# Patient Record
Sex: Female | Born: 1990 | Race: Asian | Hispanic: No | Marital: Married | Smoking: Current every day smoker
Health system: Southern US, Community
[De-identification: ages and names within clinical notes are randomized; demographics above are authoritative.]

---
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
## 2017-03-28 ENCOUNTER — Encounter: Payer: Self-pay | Admitting: Physician Assistant

## 2017-03-28 ENCOUNTER — Other Ambulatory Visit: Payer: Self-pay

## 2017-03-28 ENCOUNTER — Ambulatory Visit (INDEPENDENT_AMBULATORY_CARE_PROVIDER_SITE_OTHER): Payer: Managed Care, Other (non HMO) | Admitting: Physician Assistant

## 2017-03-28 VITALS — BP 106/73 | HR 83 | Resp 16 | Ht 66.0 in | Wt 220.4 lb

## 2017-03-28 DIAGNOSIS — J069 Acute upper respiratory infection, unspecified: Secondary | ICD-10-CM

## 2017-03-28 DIAGNOSIS — B9789 Other viral agents as the cause of diseases classified elsewhere: Secondary | ICD-10-CM

## 2017-03-28 NOTE — Patient Instructions (Addendum)
     IF you received an x-ray today, you will receive an invoice from [facility]. Please contact  Radiology at [phone] with questions or concerns regarding your invoice.   IF you received labwork today, you will receive an invoice from [facility]. Please contact [facility] at [phone] with questions or concerns regarding your invoice.   Our billing staff will not be able to assist you with questions regarding bills from these companies.  You will be contacted with the lab results as soon as they are available. The fastest way to get your results is to activate your My Chart account. Instructions are located on the last page of this paperwork. If you have not heard from us regarding the results in 2 weeks, please contact this office.     

## 2017-03-28 NOTE — Progress Notes (Signed)
    03/28/2017 11:32 AM   DOB: [birth year] / MRN: [id number]  SUBJECTIVE:  Paige Mathis is a 27 y.o. female presenting for cough times 2 weeks.  Cough came with sore throat nasal congestion, both of which have largely resolved.  She tells me her boyfriend has been ill and was seen by his clinician and given antibiotics.  She feels she is getting better cough is worse during the daytime.  She works as a [employer] and has been doing this for about 3 years now.  Denies fever.  She has no allergies on file.   She  has no past medical history on file.    She  reports that  has never smoked. she has never used smokeless tobacco. She reports that she does not drink alcohol or use drugs. She  has no sexual activity history on file. The patient  has no past surgical history on file.  Her family history is not on file.  Review of Systems  Constitutional: Negative for fever.  Gastrointestinal: Negative for nausea.  Genitourinary: Negative for dysuria.  Skin: Negative for rash.  Neurological: Negative for dizziness.    The problem list and medications were reviewed and updated by myself where necessary and exist elsewhere in the encounter.   OBJECTIVE:  BP 106/73 (BP Location: Right Arm, Patient Position: Sitting, Cuff Size: Normal)   Pulse 83   Resp 16   Ht 5\' 6"  (1.676 m)   Wt 220 lb 6.4 oz (100 kg)   SpO2 98%   BMI 35.57 kg/m   Physical Exam  Constitutional: She is active.  Non-toxic appearance.  Cardiovascular: Normal rate, regular rhythm, S1 normal, S2 normal, normal heart sounds and intact distal pulses. Exam reveals no gallop, no friction rub and no decreased pulses.  No murmur heard. Pulmonary/Chest: Effort normal. No stridor. No tachypnea. No respiratory distress. She has no wheezes. She has no rales.  Abdominal: She exhibits no distension.  Musculoskeletal: She exhibits no edema.  Neurological: She is alert.  Skin: Skin is warm and dry. She is not diaphoretic. No pallor.      No results found for this or any previous visit (from the past 72 hour(s)).  No results found.  ASSESSMENT AND PLAN:  Junod was seen today for cough.  Diagnoses and all orders for this visit:  Viral URI with cough Comments: Symptoms resolving.  Advised to stay the course.  Mucinex DM.    The patient is advised to call or return to clinic if she does not see an improvement in symptoms, or to seek the care of the closest emergency department if she worsens with the above plan.   Deliah BostonMichael Danaya Geddis, MHS, PA-C Primary Care at [facility] 03/28/2017 11:32 AM

## 2017-06-28 ENCOUNTER — Ambulatory Visit: Payer: Self-pay | Admitting: Physician Assistant

## 2017-06-28 ENCOUNTER — Ambulatory Visit (INDEPENDENT_AMBULATORY_CARE_PROVIDER_SITE_OTHER): Admitting: Family Medicine

## 2017-06-28 ENCOUNTER — Encounter: Payer: Self-pay | Admitting: Family Medicine

## 2017-06-28 VITALS — BP 110/64 | HR 99 | Temp 98.3°F | Ht 64.0 in | Wt 210.8 lb

## 2017-06-28 DIAGNOSIS — J309 Allergic rhinitis, unspecified: Secondary | ICD-10-CM

## 2017-06-28 DIAGNOSIS — R05 Cough: Secondary | ICD-10-CM

## 2017-06-28 DIAGNOSIS — R059 Cough, unspecified: Secondary | ICD-10-CM

## 2017-06-28 MED ORDER — FLUTICASONE PROPIONATE 50 MCG/ACT NA SUSP: 1.0000 | Freq: Every day | NASAL | 6 refills | Status: DC

## 2017-06-28 NOTE — Progress Notes (Signed)
Subjective:  By signing my name below, I, Essence Howell, attest that this documentation has been prepared under the direction and in the presence of Shade Flood, MD Electronically Signed: Charline Bills, ED Scribe 06/28/2017 at 10:17 AM.   Patient ID: Paige Mathis, female    DOB: 01/05/1991, 27 y.o.   MRN: [id number]  Chief Complaint  Patient presents with  . Dry Cough    since Feb. it may be allergies   HPI Paige Mathis is a 27 y.o. female who presents to Primary Care at [facility] complaining of ongoing dry cough x 3 months. Pt reports associated symptoms of nasal congestion, rhinorrhea, throat clearing, eye crusting upon waking in the morning. She has tried Careers adviser, Xyzal, Zyrtec with temporary relief, Benadryl nightly with some relief. She has noticed some drowsiness with Zyrtec. Denies fever, hemoptysis, night sweats, unexplained weight loss, heart burn, cp, sob.  There are no active problems to display for this patient.  No past medical history on file.  Allergies no known allergies Prior to Admission medications   Not on File   Social History   Socioeconomic History  . Marital status: Single    Spouse name: Not on file  . Number of children: Not on file  . Years of education: Not on file  . Highest education level: Not on file  Occupational History  . Not on file  Social Needs  . Financial resource strain: Not on file  . Food insecurity:    Worry: Not on file    Inability: Not on file  . Transportation needs:    Medical: Not on file    Non-medical: Not on file  Tobacco Use  . Smoking status: Current Every Day Smoker    Types: Cigarettes  . Smokeless tobacco: Never Used  Substance and Sexual Activity  . Alcohol use: No    Frequency: Never  . Drug use: No  . Sexual activity: Yes  Lifestyle  . Physical activity:    Days per week: Not on file    Minutes per session: Not on file  . Stress: Not on file  Relationships  . Social  connections:    Talks on phone: Not on file    Gets together: Not on file    Attends religious service: Not on file    Active member of club or organization: Not on file    Attends meetings of clubs or organizations: Not on file    Relationship status: Not on file  . Intimate partner violence:    Fear of current or ex partner: Not on file    Emotionally abused: Not on file    Physically abused: Not on file    Forced sexual activity: Not on file  Other Topics Concern  . Not on file  Social History Narrative  . Not on file   Review of Systems  Constitutional: Negative for diaphoresis, fever and unexpected weight change.  HENT: Positive for congestion and rhinorrhea.   Eyes: Positive for discharge (crusting).  Respiratory: Positive for cough. Negative for shortness of breath.   Cardiovascular: Negative for chest pain.  Allergic/Immunologic: Positive for environmental allergies.      Objective:   Physical Exam  Constitutional: She is oriented to person, place, and time. She appears well-developed and well-nourished. No distress.  HENT:  Head: Normocephalic and atraumatic.  Right Ear: Hearing, tympanic membrane, external ear and ear canal normal.  Left Ear: Hearing, tympanic membrane, external ear and ear canal normal.  Mouth/Throat: Oropharynx is clear and moist. No oropharyngeal exudate.  Slightly boggy turbinates bilaterally. Septum intact.  Eyes: Pupils are equal, round, and reactive to light. Conjunctivae and EOM are normal.  Cardiovascular: Normal rate, regular rhythm, normal heart sounds and intact distal pulses.  No murmur heard. Pulmonary/Chest: Effort normal and breath sounds normal. No stridor. No respiratory distress. She has no wheezes. She has no rhonchi.  Neurological: She is alert and oriented to person, place, and time.  Skin: Skin is warm and dry. No rash noted.  Psychiatric: She has a normal mood and affect. Her behavior is normal.  Vitals reviewed.  Vitals:     06/28/17 0947  BP: 110/64  Pulse: 99  Temp: 98.3 F (36.8 C)  TempSrc: Oral  SpO2: 98%  Weight: 210 lb 12.8 oz (95.6 kg)  Height:  (1.626 m)      Assessment & Plan:    Paige Mathis is a 27 y.o. female Allergic rhinitis, unspecified seasonality, unspecified trigger - Plan: fluticasone (FLONASE) 50 MCG/ACT nasal spray  Cough - Plan: fluticasone (FLONASE) 50 MCG/ACT nasal spray  Suspected allergies with postnasal drip.  Reassuring exam.  Continue less sedating antihistamine, with Benadryl at night only if needed.  Start Flonase nasal spray and correct technique discussed, handout given on allergies and cough.  RTC precautions if not improving in the next few weeks.  Meds ordered this encounter  Medications  . fluticasone (FLONASE) 50 MCG/ACT nasal spray    Sig: Place 1-2 sprays into both nostrils daily.    Dispense:  16 g    Refill:  6   Patient Instructions    Your cough appears to be due to allergies and postnasal drip.  See information below on cough as well as allergies.  Okay to continue Allegra once per day, Benadryl only if needed at night but start Flonase nasal spray 1 to 2 sprays in each nostril using the technique we discussed.  If that cough is not improving in the next few weeks, return to discuss other causes.  Return to the clinic or go to the nearest emergency room if any of your symptoms worsen or new symptoms occur.  Thank you for coming in today.  Allergic Rhinitis, Adult Allergic rhinitis is an allergic reaction that affects the mucous membrane inside the nose. It causes sneezing, a runny or stuffy nose, and the feeling of mucus going down the back of the throat (postnasal drip). Allergic rhinitis can be mild to severe. There are two types of allergic rhinitis:  Seasonal. This type is also called hay fever. It happens only during certain seasons.  Perennial. This type can happen at any time of the year.  What are the causes? This  condition happens when the body's defense system (immune system) responds to certain harmless substances called allergens as though they were germs.  Seasonal allergic rhinitis is triggered by pollen, which can come from grasses, trees, and weeds. Perennial allergic rhinitis may be caused by:  House dust mites.  Pet dander.  Mold spores.  What are the signs or symptoms? Symptoms of this condition include:  Sneezing.  Runny or stuffy nose (nasal congestion).  Postnasal drip.  Itchy nose.  Tearing of the eyes.  Trouble sleeping.  Daytime sleepiness.  How is this diagnosed? This condition may be diagnosed based on:  Your medical history.  A physical exam.  Tests to check for related conditions, such as: ? Asthma. ? Pink eye. ? Ear infection. ? Upper respiratory  infection.  Tests to find out which allergens trigger your symptoms. These may include skin or blood tests.  How is this treated? There is no cure for this condition, but treatment can help control symptoms. Treatment may include:  Taking medicines that block allergy symptoms, such as antihistamines. Medicine may be given as a shot, nasal spray, or pill.  Avoiding the allergen.  Desensitization. This treatment involves getting ongoing shots until your body becomes less sensitive to the allergen. This treatment may be done if other treatments do not help.  If taking medicine and avoiding the allergen does not work, new, stronger medicines may be prescribed.  Follow these instructions at home:  Find out what you are allergic to. Common allergens include smoke, dust, and pollen.  Avoid the things you are allergic to. These are some things you can do to help avoid allergens: ? Replace carpet with wood, tile, or vinyl flooring. Carpet can trap dander and dust. ? Do not smoke. Do not allow smoking in your home. ? Change your heating and air conditioning filter at least once a month. ? During allergy  season:  Keep windows closed as much as possible.  Plan outdoor activities when pollen counts are lowest. This is usually during the evening hours.  When coming indoors, change clothing and shower before sitting on furniture or bedding.  Take over-the-counter and prescription medicines only as told by your health care provider.  Keep all follow-up visits as told by your health care provider. This is important. Contact a health care provider if:  You have a fever.  You develop a persistent cough.  You make whistling sounds when you breathe (you wheeze).  Your symptoms interfere with your normal daily activities. Get help right away if:  You have shortness of breath. Summary  This condition can be managed by taking medicines as directed and avoiding allergens.  Contact your health care provider if you develop a persistent cough or fever.  During allergy season, keep windows closed as much as possible. This information is not intended to replace advice given to you by your health care provider. Make sure you discuss any questions you have with your health care provider. Document Released: 10/18/2000 Document Revised: 03/02/2016 Document Reviewed: 03/02/2016 Elsevier Interactive Patient Education  2018 [employer].  Cough, Adult Coughing is a reflex that clears your throat and your airways. Coughing helps to heal and protect your lungs. It is normal to cough occasionally, but a cough that happens with other symptoms or lasts a long time may be a sign of a condition that needs treatment. A cough may last only 2-3 weeks (acute), or it may last longer than 8 weeks (chronic). What are the causes? Coughing is commonly caused by:  Breathing in substances that irritate your lungs.  A viral or bacterial respiratory infection.  Allergies.  Asthma.  Postnasal drip.  Smoking.  Acid backing up from the stomach into the esophagus (gastroesophageal reflux).  Certain  medicines.  Chronic lung problems, including COPD (or rarely, lung cancer).  Other medical conditions such as heart failure.  Follow these instructions at home: Pay attention to any changes in your symptoms. Take these actions to help with your discomfort:  Take medicines only as told by your health care provider. ? If you were prescribed an antibiotic medicine, take it as told by your health care provider. Do not stop taking the antibiotic even if you start to feel better. ? Talk with your health care provider before you  take a cough suppressant medicine.  Drink enough fluid to keep your urine clear or pale yellow.  If the air is dry, use a cold steam vaporizer or humidifier in your bedroom or your home to help loosen secretions.  Avoid anything that causes you to cough at work or at home.  If your cough is worse at night, try sleeping in a semi-upright position.  Avoid cigarette smoke. If you smoke, quit smoking. If you need help quitting, ask your health care provider.  Avoid caffeine.  Avoid alcohol.  Rest as needed.  Contact a health care provider if:  You have new symptoms.  You cough up pus.  Your cough does not get better after 2-3 weeks, or your cough gets worse.  You cannot control your cough with suppressant medicines and you are losing sleep.  You develop pain that is getting worse or pain that is not controlled with pain medicines.  You have a fever.  You have unexplained weight loss.  You have night sweats. Get help right away if:  You cough up blood.  You have difficulty breathing.  Your heartbeat is very fast. This information is not intended to replace advice given to you by your health care provider. Make sure you discuss any questions you have with your health care provider. Document Released: 07/22/2010 Document Revised: 07/01/2015 Document Reviewed: 04/01/2014 Elsevier Interactive Patient Education  2018 [employer].    IF you received  an x-ray today, you will receive an invoice from [facility]. Please contact [facility] at [phone] with questions or concerns regarding your invoice.   IF you received labwork today, you will receive an invoice from [facility]. Please contact [facility] at [phone] with questions or concerns regarding your invoice.   Our billing staff will not be able to assist you with questions regarding bills from these companies.  You will be contacted with the lab results as soon as they are available. The fastest way to get your results is to activate your My Chart account. Instructions are located on the last page of this paperwork. If you have not heard from [place] regarding the results in 2 weeks, please contact this office.       I personally performed the services described in this documentation, which was scribed in my presence. The recorded information has been reviewed and considered for accuracy and completeness, addended by me as needed, and agree with information above.  Signed,   Meredith Staggers, MD Primary Care at [facility].  06/28/17 10:31 AM

## 2017-06-28 NOTE — Patient Instructions (Addendum)
Your cough appears to be due to allergies and postnasal drip.  See information below on cough as well as allergies.  Okay to continue Allegra once per day, Benadryl only if needed at night but start Flonase nasal spray 1 to 2 sprays in each nostril using the technique we discussed.  If that cough is not improving in the next few weeks, return to discuss other causes.  Return to the clinic or go to the nearest emergency room if any of your symptoms worsen or new symptoms occur.  Thank you for coming in today.  Allergic Rhinitis, Adult Allergic rhinitis is an allergic reaction that affects the mucous membrane inside the nose. It causes sneezing, a runny or stuffy nose, and the feeling of mucus going down the back of the throat (postnasal drip). Allergic rhinitis can be mild to severe. There are two types of allergic rhinitis:  Seasonal. This type is also called hay fever. It happens only during certain seasons.  Perennial. This type can happen at any time of the year.  What are the causes? This condition happens when the body's defense system (immune system) responds to certain harmless substances called allergens as though they were germs.  Seasonal allergic rhinitis is triggered by pollen, which can come from grasses, trees, and weeds. Perennial allergic rhinitis may be caused by:  House dust mites.  Pet dander.  Mold spores.  What are the signs or symptoms? Symptoms of this condition include:  Sneezing.  Runny or stuffy nose (nasal congestion).  Postnasal drip.  Itchy nose.  Tearing of the eyes.  Trouble sleeping.  Daytime sleepiness.  How is this diagnosed? This condition may be diagnosed based on:  Your medical history.  A physical exam.  Tests to check for related conditions, such as: ? Asthma. ? Pink eye. ? Ear infection. ? Upper respiratory infection.  Tests to find out which allergens trigger your symptoms. These may include skin or blood tests.  How is  this treated? There is no cure for this condition, but treatment can help control symptoms. Treatment may include:  Taking medicines that block allergy symptoms, such as antihistamines. Medicine may be given as a shot, nasal spray, or pill.  Avoiding the allergen.  Desensitization. This treatment involves getting ongoing shots until your body becomes less sensitive to the allergen. This treatment may be done if other treatments do not help.  If taking medicine and avoiding the allergen does not work, new, stronger medicines may be prescribed.  Follow these instructions at home:  Find out what you are allergic to. Common allergens include smoke, dust, and pollen.  Avoid the things you are allergic to. These are some things you can do to help avoid allergens: ? Replace carpet with wood, tile, or vinyl flooring. Carpet can trap dander and dust. ? Do not smoke. Do not allow smoking in your home. ? Change your heating and air conditioning filter at least once a month. ? During allergy season:  Keep windows closed as much as possible.  Plan outdoor activities when pollen counts are lowest. This is usually during the evening hours.  When coming indoors, change clothing and shower before sitting on furniture or bedding.  Take over-the-counter and prescription medicines only as told by your health care provider.  Keep all follow-up visits as told by your health care provider. This is important. Contact a health care provider if:  You have a fever.  You develop a persistent cough.  You make whistling sounds when  you breathe (you wheeze).  Your symptoms interfere with your normal daily activities. Get help right away if:  You have shortness of breath. Summary  This condition can be managed by taking medicines as directed and avoiding allergens.  Contact your health care provider if you develop a persistent cough or fever.  During allergy season, keep windows closed as much as  possible. This information is not intended to replace advice given to you by your health care provider. Make sure you discuss any questions you have with your health care provider. Document Released: 10/18/2000 Document Revised: 03/02/2016 Document Reviewed: 03/02/2016 Elsevier Interactive Patient Education  2018 [employer].  Cough, Adult Coughing is a reflex that clears your throat and your airways. Coughing helps to heal and protect your lungs. It is normal to cough occasionally, but a cough that happens with other symptoms or lasts a long time may be a sign of a condition that needs treatment. A cough may last only 2-3 weeks (acute), or it may last longer than 8 weeks (chronic). What are the causes? Coughing is commonly caused by:  Breathing in substances that irritate your lungs.  A viral or bacterial respiratory infection.  Allergies.  Asthma.  Postnasal drip.  Smoking.  Acid backing up from the stomach into the esophagus (gastroesophageal reflux).  Certain medicines.  Chronic lung problems, including COPD (or rarely, lung cancer).  Other medical conditions such as heart failure.  Follow these instructions at home: Pay attention to any changes in your symptoms. Take these actions to help with your discomfort:  Take medicines only as told by your health care provider. ? If you were prescribed an antibiotic medicine, take it as told by your health care provider. Do not stop taking the antibiotic even if you start to feel better. ? Talk with your health care provider before you take a cough suppressant medicine.  Drink enough fluid to keep your urine clear or pale yellow.  If the air is dry, use a cold steam vaporizer or humidifier in your bedroom or your home to help loosen secretions.  Avoid anything that causes you to cough at work or at home.  If your cough is worse at night, try sleeping in a semi-upright position.  Avoid cigarette smoke. If you smoke, quit  smoking. If you need help quitting, ask your health care provider.  Avoid caffeine.  Avoid alcohol.  Rest as needed.  Contact a health care provider if:  You have new symptoms.  You cough up pus.  Your cough does not get better after 2-3 weeks, or your cough gets worse.  You cannot control your cough with suppressant medicines and you are losing sleep.  You develop pain that is getting worse or pain that is not controlled with pain medicines.  You have a fever.  You have unexplained weight loss.  You have night sweats. Get help right away if:  You cough up blood.  You have difficulty breathing.  Your heartbeat is very fast. This information is not intended to replace advice given to you by your health care provider. Make sure you discuss any questions you have with your health care provider. Document Released: 07/22/2010 Document Revised: 07/01/2015 Document Reviewed: 04/01/2014 Elsevier Interactive Patient Education  2018 [employer].    IF you received an x-ray today, you will receive an invoice from [facility]. Please contact [facility] at [phone] with questions or concerns regarding your invoice.   IF you received labwork today, you will receive  an Pharmacologist from [facility]. Please contact [place] at [phone] with questions or concerns regarding your invoice.   Our billing staff will not be able to assist you with questions regarding bills from these companies.  You will be contacted with the lab results as soon as they are available. The fastest way to get your results is to activate your My Chart account. Instructions are located on the last page of this paperwork. If you have not heard from [place] regarding the results in 2 weeks, please contact this office.

## 2019-08-07 ENCOUNTER — Ambulatory Visit (INDEPENDENT_AMBULATORY_CARE_PROVIDER_SITE_OTHER)
Admission: EM | Admit: 2019-08-07 | Discharge: 2019-08-07 | Disposition: A | Discharge status: Home / Self Care | Source: Home / Self Care

## 2019-08-07 ENCOUNTER — Encounter (HOSPITAL_COMMUNITY): Payer: Self-pay

## 2019-08-07 ENCOUNTER — Encounter (HOSPITAL_COMMUNITY): Payer: Self-pay | Admitting: Emergency Medicine

## 2019-08-07 ENCOUNTER — Ambulatory Visit (INDEPENDENT_AMBULATORY_CARE_PROVIDER_SITE_OTHER)

## 2019-08-07 ENCOUNTER — Emergency Department (HOSPITAL_COMMUNITY)
Admission: EM | Admit: 2019-08-07 | Discharge: 2019-08-07 | Disposition: A | Discharge status: Home / Self Care | Attending: Emergency Medicine | Admitting: Emergency Medicine

## 2019-08-07 ENCOUNTER — Other Ambulatory Visit: Payer: Self-pay

## 2019-08-07 ENCOUNTER — Emergency Department (HOSPITAL_COMMUNITY)

## 2019-08-07 DIAGNOSIS — S97111A Crushing injury of right great toe, initial encounter: Secondary | ICD-10-CM

## 2019-08-07 DIAGNOSIS — S92424B Nondisplaced fracture of distal phalanx of right great toe, initial encounter for open fracture: Secondary | ICD-10-CM | POA: Diagnosis not present

## 2019-08-07 DIAGNOSIS — S91211A Laceration without foreign body of right great toe with damage to nail, initial encounter: Secondary | ICD-10-CM | POA: Diagnosis not present

## 2019-08-07 DIAGNOSIS — Y929 Unspecified place or not applicable: Secondary | ICD-10-CM | POA: Insufficient documentation

## 2019-08-07 DIAGNOSIS — F17210 Nicotine dependence, cigarettes, uncomplicated: Secondary | ICD-10-CM | POA: Diagnosis not present

## 2019-08-07 DIAGNOSIS — S6991XA Unspecified injury of right wrist, hand and finger(s), initial encounter: Secondary | ICD-10-CM

## 2019-08-07 DIAGNOSIS — S90111A Contusion of right great toe without damage to nail, initial encounter: Secondary | ICD-10-CM

## 2019-08-07 DIAGNOSIS — Z23 Encounter for immunization: Secondary | ICD-10-CM | POA: Diagnosis not present

## 2019-08-07 DIAGNOSIS — S92421A Displaced fracture of distal phalanx of right great toe, initial encounter for closed fracture: Secondary | ICD-10-CM

## 2019-08-07 DIAGNOSIS — Y939 Activity, unspecified: Secondary | ICD-10-CM | POA: Insufficient documentation

## 2019-08-07 DIAGNOSIS — S91111A Laceration without foreign body of right great toe without damage to nail, initial encounter: Secondary | ICD-10-CM | POA: Diagnosis present

## 2019-08-07 DIAGNOSIS — M7989 Other specified soft tissue disorders: Secondary | ICD-10-CM

## 2019-08-07 DIAGNOSIS — S99921A Unspecified injury of right foot, initial encounter: Secondary | ICD-10-CM

## 2019-08-07 DIAGNOSIS — W230XXA Caught, crushed, jammed, or pinched between moving objects, initial encounter: Secondary | ICD-10-CM | POA: Diagnosis not present

## 2019-08-07 DIAGNOSIS — Y999 Unspecified external cause status: Secondary | ICD-10-CM | POA: Insufficient documentation

## 2019-08-07 MED ORDER — LIDOCAINE HCL (PF) 1 % IJ SOLN
10.0000 mL | Freq: Once | INTRAMUSCULAR | Status: AC
  Administered 2019-08-07: 10 mL
  Filled 2019-08-07: qty 30

## 2019-08-07 MED ORDER — CEPHALEXIN 500 MG PO CAPS
500.0000 mg | ORAL_CAPSULE | Freq: Four times a day (QID) | ORAL | 0 refills | Status: DC
Start: 2019-08-07 — End: 2019-08-08

## 2019-08-07 MED ORDER — TETANUS-DIPHTH-ACELL PERTUSSIS 5-2.5-18.5 LF-MCG/0.5 IM SUSP
0.5000 mL | Freq: Once | INTRAMUSCULAR | Status: AC
  Administered 2019-08-07: 0.5 mL via INTRAMUSCULAR
  Filled 2019-08-07: qty 0.5

## 2019-08-07 MED ORDER — NAPROXEN 500 MG PO TABS
500.0000 mg | ORAL_TABLET | Freq: Two times a day (BID) | ORAL | 0 refills | Status: DC
Start: 2019-08-07 — End: 2019-08-08
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
## 2019-08-07 NOTE — ED Notes (Signed)
Pt transported to xray 

## 2019-08-07 NOTE — Discharge Instructions (Signed)
Unfortunately, your toe is fractured underneath the laceration.  You will need to go to [facility] or [facility] ER from here to have your toe repaired

## 2019-08-07 NOTE — ED Notes (Signed)
Patient is being discharged from the Urgent Care and sent to the Emergency Department via private vehicle . Per Blondell Reveal, NP, patient is in need of higher level of care due to toe fracture. Patient is aware and verbalizes understanding of plan of care.  Vitals:   08/07/19 1625  BP: 129/78  Pulse: 86  Resp: 18  Temp: 98.6 F (37 C)  SpO2: 100%
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
## 2019-08-07 NOTE — Discharge Instructions (Addendum)
As discussed, your x-ray showed an underlying broken bone. I have placed you in a shoe with crutches.  I have included the number of the orthopedic surgeon.  Please call tomorrow to schedule an appointment for further evaluation.  Please have a wound recheck on Friday by your PCP.  I am sending you home with an antibiotic and pain medication.  Take as prescribed.  Finish all antibiotics.  You will need your sutures removed in 10 to 14 days.  Return to the ER for new or worsening symptoms.

## 2019-08-08 ENCOUNTER — Ambulatory Visit (INDEPENDENT_AMBULATORY_CARE_PROVIDER_SITE_OTHER): Admitting: Family Medicine

## 2019-08-08 VITALS — BP 107/73 | HR 93 | Temp 98.0°F | Ht 64.0 in | Wt 219.0 lb

## 2019-08-08 DIAGNOSIS — S92401D Displaced unspecified fracture of right great toe, subsequent encounter for fracture with routine healing: Secondary | ICD-10-CM | POA: Diagnosis not present

## 2019-08-08 DIAGNOSIS — S91209D Unspecified open wound of unspecified toe(s) with damage to nail, subsequent encounter: Secondary | ICD-10-CM

## 2019-08-08 NOTE — Patient Instructions (Addendum)
Referral is being made to orthopedics  Take the naproxen 500 mg 1 twice daily for pain (you can substitute Aleve 220 mg 2 pills twice daily if needed).  Do not take ibuprofen while taking Aleve/naproxen.  In addition to this you can take Tylenol (acetaminophen) 500 mg 2 pills 3 times daily.  Do not exceed 3000 mg in 24 hours.  Minimize weightbearing.  Use your crutches as needed and wear the hard soled shoe.  Try and taper a plastic bag around your foot when you shower to keep it dry.  In about 3 or 4 days go ahead and change the dressing as we discussed.  You can work as long as you are not weightbearing if you can tolerate the discomfort.  I anticipate that the orthopedic doctor will follow you and remove the sutures, but if you need to return here in 2 weeks to get the sutures removed you can do so.  Call for appointment.  Return sooner if problems.  If you have lab work done today you will be contacted with your lab results within the next 2 weeks.  If you have not heard from [place] then please contact us. The fastest way to get your results is to register for My Chart.   IF you received an x-ray today, you will receive an invoice from [facility]. Please contact [facility] at [phone] with questions or concerns regarding your invoice.   IF you received labwork today, you will receive an invoice from [place]. Please contact [facility] at [phone] with questions or concerns regarding your invoice.   Our billing staff will not be able to assist you with questions regarding bills from these companies.  You will be contacted with the lab results as soon as they are available. The fastest way to get your results is to activate your My Chart account. Instructions are located on the last page of this paperwork. If you have not heard from [place] regarding the results in 2 weeks, please contact this office.

## 2019-08-13 ENCOUNTER — Ambulatory Visit (INDEPENDENT_AMBULATORY_CARE_PROVIDER_SITE_OTHER): Admitting: Orthopaedic Surgery

## 2019-08-13 ENCOUNTER — Encounter: Payer: Self-pay | Admitting: Orthopaedic Surgery

## 2019-08-13 ENCOUNTER — Other Ambulatory Visit: Payer: Self-pay

## 2019-08-13 DIAGNOSIS — S92421B Displaced fracture of distal phalanx of right great toe, initial encounter for open fracture: Secondary | ICD-10-CM | POA: Diagnosis not present

## 2019-08-13 DIAGNOSIS — S92919A Unspecified fracture of unspecified toe(s), initial encounter for closed fracture: Secondary | ICD-10-CM | POA: Insufficient documentation
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
## 2019-08-14 NOTE — Progress Notes (Signed)
Patient ID: Paige Mathis, female    DOB: 09/19/1990  Age: 29 y.o. MRN: [id number]  Chief Complaint  Patient presents with  . Foot Pain    Pt stated that she slammed her rt foot in the door and the toe nail fell off. She comes in today on crutches    Subjective:   Patient slammed the toe of her right foot, large toe, under a door when it hung up on a little ledge.  She had a lot of pain.  She went to the urgent care and on over to the emergency room where x-ray confirmed a fracture of the tip of the tuft of the toe.  It was open and comminuted and mildly displaced.  She came in today again because she is having a lot of pain and wanted to discuss it.  It is been carefully dressed and bound last night.  Current allergies, medications, problem list, past/family and social histories reviewed.  Objective:  BP 107/73   Pulse 93   Temp 98 F (36.7 C) (Temporal)   Ht 5\' 4"  (1.626 m)   Wt 219 lb (99.3 kg)   LMP 08/03/2019   SpO2 97%   BMI 37.59 kg/m   I did not undress the toe.  Discussed treatment with her.  Assessment & Plan:   Assessment: 1. Open displaced fracture of phalanx of right great toe with routine healing, unspecified phalanx, subsequent encounter   2. Open wound of toe with avulsion of toenail, subsequent encounter       Plan: Adjusted her crutches and made referral to orthopedics and instructed her on care and management.  See discharge instructions.  Orders Placed This Encounter  Procedures  . Ambulatory referral to Orthopedic Surgery    Referral Priority:   Routine    Referral Type:   Surgical    Referral Reason:   Specialty Services Required    Requested Specialty:   Orthopedic Surgery    Number of Visits Requested:   1    No orders of the defined types were placed in this encounter.        Patient Instructions   Referral is being made to orthopedics  Take the naproxen 500 mg 1 twice daily for pain (you can substitute Aleve 220 mg 2  pills twice daily if needed).  Do not take ibuprofen while taking Aleve/naproxen.  In addition to this you can take Tylenol (acetaminophen) 500 mg 2 pills 3 times daily.  Do not exceed 3000 mg in 24 hours.  Minimize weightbearing.  Use your crutches as needed and wear the hard soled shoe.  Try and taper a plastic bag around your foot when you shower to keep it dry.  In about 3 or 4 days go ahead and change the dressing as we discussed.  You can work as long as you are not weightbearing if you can tolerate the discomfort.  I anticipate that the orthopedic doctor will follow you and remove the sutures, but if you need to return here in 2 weeks to get the sutures removed you can do so.  Call for appointment.  Return sooner if problems.  If you have lab work done today you will be contacted with your lab results within the next 2 weeks.  If you have not heard from 08/05/2019 then please contact us. The fastest way to get your results is to register for My Chart.   IF you received an x-ray today, you will receive an invoice from  [facility]. Please contact [facility] at [phone] with questions or concerns regarding your invoice.   IF you received labwork today, you will receive an invoice from [place]. Please contact [facility] at [phone] with questions or concerns regarding your invoice.   Our billing staff will not be able to assist you with questions regarding bills from these companies.  You will be contacted with the lab results as soon as they are available. The fastest way to get your results is to activate your My Chart account. Instructions are located on the last page of this paperwork. If you have not heard from [place] regarding the results in 2 weeks, please contact this office.        Return in about 2 weeks (around 08/22/2019), or Return if not able to follow with orthopedist..   Janace Hoard, MD 08/14/2019

## 2019-08-22 ENCOUNTER — Ambulatory Visit (INDEPENDENT_AMBULATORY_CARE_PROVIDER_SITE_OTHER): Admitting: Orthopaedic Surgery

## 2019-08-22 ENCOUNTER — Other Ambulatory Visit: Payer: Self-pay

## 2019-08-22 ENCOUNTER — Ambulatory Visit: Admitting: Family Medicine

## 2019-08-22 ENCOUNTER — Encounter: Payer: Self-pay | Admitting: Orthopaedic Surgery

## 2019-08-22 DIAGNOSIS — S92421D Displaced fracture of distal phalanx of right great toe, subsequent encounter for fracture with routine healing: Secondary | ICD-10-CM

## 2019-08-22 NOTE — Progress Notes (Signed)
Office Visit Note   Patient: Paige Mathis           Date of Birth: Jan 12, 1991           MRN: [id number] Visit Date: 08/22/2019              Requested by: No referring provider defined for this encounter. PCP: Shade Flood, MD   Assessment & Plan: Visit Diagnoses:  1. Open displaced fracture of distal phalanx of right great toe with routine healing, subsequent encounter     Plan: Paige Mathis is several weeks status post injury to her right great toe that involved an open tuft fracture and partial nail avulsion.  She was initially evaluated in the emergency room where they stitched the wound.  She is finished a course of Keflex.  She notes she is not had any fever chills or significant pain with the toe.  There is no evidence of infection.  I plan on removing the stitches today and apply Band-Aid with mupropicin.  She would like to continue using the wooden shoe for short time.  I do not need to see her back unless she has an issue.  There is very little swelling and there is no obvious deformity.  She may or may not lose the nail but hopefully she will have a new nail in its place  Follow-Up Instructions: Return if symptoms worsen or fail to improve.   Orders:  No orders of the defined types were placed in this encounter.  No orders of the defined types were placed in this encounter.     Procedures: No procedures performed   Clinical Data: No additional findings.   Subjective: Chief Complaint  Patient presents with  . Right Great Toe - Pain   2 weeks status post injury to right great toe as previously outlined.  There was an open tuft fracture with possible nail avulsion.  The nail is still intact.  No fever or chills.  Finished a course of Keflex. HPI  Review of Systems   Objective: Vital Signs: LMP 08/03/2019   Physical Exam  Ortho Exam right great toe laceration at the base of the nail and the medial aspect of the great toe is healing without  problem.  The stitches were removed.  Good capillary refill to the toe.  Very minimal swelling.  Normal sensation.  No deformity.  The nail appears to be stable but could have been avulsed in which case it will eventually fall off and and hopefully a new nail will grow from the matrix  Specialty Comments:  No specialty comments available.  Imaging: No results found.   PMFS History: Patient Active Problem List   Diagnosis Date Noted  . Phalanx of the foot fracture 08/13/2019   History reviewed. No pertinent past medical history.  Family History  Problem Relation Age of Onset  . Healthy Mother   . Healthy Father     History reviewed. No pertinent surgical history. Social History   Occupational History  . Not on file  Tobacco Use  . Smoking status: Current Every Day Smoker    Types: Cigarettes  . Smokeless tobacco: Never Used  Substance and Sexual Activity  . Alcohol use: No  . Drug use: No  . Sexual activity: Yes     Paige Batman, MD   Note - This record has been created using [employer].  Chart creation errors have been sought, but may not always  have been located.  Such creation errors do not reflect on  the standard of medical care.

## 2019-09-05 ENCOUNTER — Emergency Department (HOSPITAL_COMMUNITY)

## 2019-09-05 ENCOUNTER — Encounter: Payer: Self-pay | Admitting: Family Medicine

## 2019-09-05 ENCOUNTER — Other Ambulatory Visit: Payer: Self-pay

## 2019-09-05 ENCOUNTER — Telehealth (INDEPENDENT_AMBULATORY_CARE_PROVIDER_SITE_OTHER): Admitting: Family Medicine

## 2019-09-05 ENCOUNTER — Emergency Department (HOSPITAL_COMMUNITY)
Admission: EM | Admit: 2019-09-05 | Discharge: 2019-09-05 | Disposition: A | Discharge status: Home / Self Care | Attending: Emergency Medicine | Admitting: Emergency Medicine

## 2019-09-05 ENCOUNTER — Encounter (HOSPITAL_COMMUNITY): Payer: Self-pay

## 2019-09-05 DIAGNOSIS — R05 Cough: Secondary | ICD-10-CM | POA: Diagnosis not present

## 2019-09-05 DIAGNOSIS — Z20828 Contact with and (suspected) exposure to other viral communicable diseases: Secondary | ICD-10-CM | POA: Insufficient documentation

## 2019-09-05 DIAGNOSIS — R0602 Shortness of breath: Secondary | ICD-10-CM | POA: Insufficient documentation

## 2019-09-05 DIAGNOSIS — R21 Rash and other nonspecific skin eruption: Secondary | ICD-10-CM | POA: Diagnosis not present

## 2019-09-05 DIAGNOSIS — F17210 Nicotine dependence, cigarettes, uncomplicated: Secondary | ICD-10-CM | POA: Diagnosis not present

## 2019-09-05 DIAGNOSIS — U071 COVID-19: Secondary | ICD-10-CM | POA: Diagnosis not present

## 2019-09-05 DIAGNOSIS — J069 Acute upper respiratory infection, unspecified: Secondary | ICD-10-CM | POA: Diagnosis not present

## 2019-09-05 DIAGNOSIS — R5383 Other fatigue: Secondary | ICD-10-CM | POA: Insufficient documentation

## 2019-09-05 DIAGNOSIS — R0600 Dyspnea, unspecified: Secondary | ICD-10-CM | POA: Diagnosis not present

## 2019-09-05 DIAGNOSIS — R059 Cough, unspecified: Secondary | ICD-10-CM

## 2019-09-05 DIAGNOSIS — Z20822 Contact with and (suspected) exposure to covid-19: Secondary | ICD-10-CM

## 2019-09-05 MED ORDER — DOXYCYCLINE HYCLATE 100 MG PO CAPS: 100.0000 mg | ORAL_CAPSULE | Freq: Two times a day (BID) | ORAL | 0 refills | Status: DC

## 2019-09-05 MED ORDER — DEXAMETHASONE 4 MG PO TABS
10.0000 mg | ORAL_TABLET | Freq: Once | ORAL | Status: AC
  Administered 2019-09-05: 10 mg via ORAL
  Filled 2019-09-05: qty 2

## 2019-09-05 MED ORDER — BENZONATATE 100 MG PO CAPS
100.0000 mg | ORAL_CAPSULE | Freq: Three times a day (TID) | ORAL | 0 refills | Status: DC
Start: 2019-09-05 — End: 2020-04-19

## 2019-09-05 MED ORDER — ONDANSETRON 4 MG PO TBDP
ORAL_TABLET | ORAL | 0 refills | Status: DC
Start: 2019-09-05 — End: 2020-04-19

## 2019-09-05 NOTE — Progress Notes (Signed)
Virtual Visit via Video Note  I connected with Paige Mathis on 09/05/19 at 5:35 PM by a video enabled telemedicine application and verified that I am speaking with the correct person using two identifiers.  Patient location:home My location: office   I discussed the limitations, risks, security and privacy concerns of performing an evaluation and management service by telephone and the availability of in person appointments. I also discussed with the patient that there may be a patient responsible charge related to this service. The patient expressed understanding and agreed to proceed, consent obtained  Chief complaint:  Chief Complaint  Patient presents with  . Cough    patient states she just returned from [place] on 09/02/2019 and since that day she was feels sick. Patient is exeperieng symptoms of Fever, Chills, cough, Fatigue and also have a rash thats under the breast area. Per patient she has scheduled an Appointment today @ 11:40 am to get Covid testing. She is using tylenol to try to reduce the fever at the time     History of Present Illness: Paige Mathis is a 29 y.o. female   Cough: Started about 3 days ago. Hot and cold spells. No measured fever - no thermometer at home.  Feel short of breath at rest today. Noted since yesterday - worse shortness of breath today.  Decreased appetite, nasal congestion, unknown if change in taste or smell.  Visited [place], went to [employer]. Was wearing mask, but sometimes without mask at times.  Has not been vaccinated against Covid.  Returned from [place] on 09/02/19.  Mom will be home   Send out Covid test at [facility] today.   Rash Under R breast. Pus expressed and applies to neosporin.  Small cut from bra wire initially July 22nd.  Rash is getting worse - spreading, and looks raw.     Patient Active Problem List   Diagnosis Date Noted  . Phalanx of the foot fracture 08/13/2019   No past medical history on  file. No past surgical history on file. No Known Allergies Prior to Admission medications   Medication Sig Start Date End Date Taking? Authorizing Provider  acetaminophen (TYLENOL) 325 MG tablet Take 650 mg by mouth every 6 (six) hours as needed.   Yes [provider]   Social History   Socioeconomic History  . Marital status: Single    Spouse name: Not on file  . Number of children: Not on file  . Years of education: Not on file  . Highest education level: Not on file  Occupational History  . Not on file  Tobacco Use  . Smoking status: Current Every Day Smoker    Types: Cigarettes  . Smokeless tobacco: Never Used  Substance and Sexual Activity  . Alcohol use: No  . Drug use: No  . Sexual activity: Yes  Other Topics Concern  . Not on file  Social History Narrative  . Not on file   Social Determinants of Health   Financial Resource Strain:   . Difficulty of Paying Living Expenses:   Food Insecurity:   . Worried About Programme researcher, broadcasting/film/video in the Last Year:   . Barista in the Last Year:   Transportation Needs:   . Freight forwarder (Medical):   Marland Kitchen Lack of Transportation (Non-Medical):   Physical Activity:   . Days of Exercise per Week:   . Minutes of Exercise per Session:   Stress:   . Feeling of Stress :  Social Connections:   . Frequency of Communication with Friends and Family:   . Frequency of Social Gatherings with Friends and Family:   . Attends Religious Services:   . Active Member of Clubs or Organizations:   . Attends Banker Meetings:   Marland Kitchen Marital Status:   Intimate Partner Violence:   . Fear of Current or Ex-Partner:   . Emotionally Abused:   Marland Kitchen Physically Abused:   . Sexually Abused:     Observations/Objective: There were no vitals filed for this visit. Appears uncomfortable on exam on video, laying down for most of video exam.  Able to speak but some shortened sentences.  She is expressed understanding of plan and  was coherent with history, sometimes repeating questions but ultimately was able to provide information.  Assessment and Plan: Cough  Dyspnea, unspecified type  Rash and nonspecific skin eruption  3-day history of cough, subjective fever and chills and dyspnea.  Progressive shortness of breath, at rest yesterday to today with some shortness sentences on evaluation over video.  Concerning for COVID-19 with acute respiratory distress, ER evaluation recommended now.  Option of EMS transport, states her mom will be home shortly and will go by private vehicle.  911 recommended if any change.  Inframammary rash on right side reported, not examined over video.  Based on her history this should also be evaluated through the ER, concern for possible cellulitis versus abscess from initial wound.  Understanding of plan was expressed with read back approach.  Follow Up Instructions:   ER eval tonight.  I discussed the assessment and treatment plan with the patient. The patient was provided an opportunity to ask questions and all were answered. The patient agreed with the plan and demonstrated an understanding of the instructions.   The patient was advised to call back or seek an in-person evaluation if the symptoms worsen or if the condition fails to improve as anticipated.  I provided 16 minutes of non-face-to-face time during this encounter.   Shade Flood, MD

## 2019-09-05 NOTE — Patient Instructions (Signed)
° ° ° °  If you have lab work done today you will be contacted with your lab results within the next 2 weeks.  If you have not heard from us then please contact us. The fastest way to get your results is to register for My Chart. ° ° °IF you received an x-ray today, you will receive an invoice from [facility]. Please contact [facility] at [phone] with questions or concerns regarding your invoice.  ° °IF you received labwork today, you will receive an invoice from [facility]. Please contact [facility] at [phone] with questions or concerns regarding your invoice.  ° °Our billing staff will not be able to assist you with questions regarding bills from these companies. ° °You will be contacted with the lab results as soon as they are available. The fastest way to get your results is to activate your My Chart account. Instructions are located on the last page of this paperwork. If you have not heard from us regarding the results in 2 weeks, please contact this office. °  ° ° ° °

## 2019-09-05 NOTE — ED Provider Notes (Signed)
[facility]-EMERGENCY DEPT Provider Note   CSN: [id number] Arrival date & time: 09/05/19  1910     History Chief Complaint  Patient presents with  . Suspected Covid    Paige Mathis is a 29 y.o. female.  29 yo F with a chief complaints of cough congestion fever chills myalgias going on for about a week.  Patient having some fatigue and shortness of breath as well.  Patient has been in [place] and went to the music festival.  Denies any specific sick contacts.  The history is provided by the patient.  Illness Severity:  Moderate Onset quality:  Gradual Duration:  1 week Timing:  Constant Progression:  Unchanged Chronicity:  New Associated symptoms: cough, fever and shortness of breath   Associated symptoms: no chest pain, no congestion, no headaches, no myalgias, no nausea, no rhinorrhea, no vomiting and no wheezing        History reviewed. No pertinent past medical history.  Patient Active Problem List   Diagnosis Date Noted  . Phalanx of the foot fracture 08/13/2019    History reviewed. No pertinent surgical history.   OB History   No obstetric history on file.     Family History  Problem Relation Age of Onset  . Healthy Mother   . Healthy Father     Social History   Tobacco Use  . Smoking status: Current Every Day Smoker    Types: Cigarettes  . Smokeless tobacco: Never Used  Substance Use Topics  . Alcohol use: No  . Drug use: No    Home Medications Prior to Admission medications   Medication Sig Start Date End Date Taking? Authorizing Provider  acetaminophen (TYLENOL) 325 MG tablet Take 650 mg by mouth every 6 (six) hours as needed.    [provider]  benzonatate (TESSALON) 100 MG capsule Take 1 capsule (100 mg total) by mouth every 8 (eight) hours. 09/05/19   Melene Plan, DO  doxycycline (VIBRAMYCIN) 100 MG capsule Take 1 capsule (100 mg total) by mouth 2 (two) times daily. One po bid x 7 days 09/05/19    Melene Plan, DO  ondansetron [facility] ODT) 4 MG disintegrating tablet 4mg  ODT q4 hours prn nausea/vomit 09/05/19   09/07/19, DO    Allergies    Patient has no known allergies.  Review of Systems   Review of Systems  Constitutional: Positive for chills and fever.  HENT: Negative for congestion and rhinorrhea.   Eyes: Negative for redness and visual disturbance.  Respiratory: Positive for cough and shortness of breath. Negative for wheezing.   Cardiovascular: Negative for chest pain and palpitations.  Gastrointestinal: Negative for nausea and vomiting.  Genitourinary: Negative for dysuria and urgency.  Musculoskeletal: Negative for arthralgias and myalgias.  Skin: Negative for pallor and wound.  Neurological: Negative for dizziness and headaches.    Physical Exam Updated Vital Signs BP (!) 130/65 (BP Location: Left Arm)   Pulse (!) 110   Temp 99.1 F (37.3 C) (Oral)   Resp 20   Ht 5\' 4"  (1.626 m)   Wt (!) 95.3 kg   SpO2 99%   BMI 36.05 kg/m   Physical Exam Vitals and nursing note reviewed.  Constitutional:      General: She is not in acute distress.    Appearance: She is well-developed. She is not diaphoretic.  HENT:     Head: Normocephalic and atraumatic.  Eyes:     Pupils: Pupils are equal, round, and reactive to light.  Cardiovascular:     Rate and Rhythm: Normal rate and regular rhythm.     Heart sounds: No murmur heard.  No friction rub. No gallop.   Pulmonary:     Effort: Pulmonary effort is normal.     Breath sounds: No wheezing or rales.  Chest:    Abdominal:     General: There is no distension.     Palpations: Abdomen is soft.     Tenderness: There is no abdominal tenderness.  Musculoskeletal:        General: No tenderness.     Cervical back: Normal range of motion and neck supple.  Skin:    General: Skin is warm and dry.  Neurological:     Mental Status: She is alert and oriented to person, place, and time.  Psychiatric:        Behavior:  Behavior normal.     ED Results / Procedures / Treatments   Labs (all labs ordered are listed, but only abnormal results are displayed) Labs Reviewed  SARS CORONAVIRUS 2 BY RT PCR (HOSPITAL ORDER, PERFORMED IN [facility])    EKG None  Radiology DG Chest 2 View  Result Date: 09/05/2019 CLINICAL DATA:  Shortness of breath and cough EXAM: CHEST - 2 VIEW COMPARISON:  None. FINDINGS: The heart size and mediastinal contours are within normal limits. Both lungs are clear. The visualized skeletal structures are unremarkable. IMPRESSION: No active cardiopulmonary disease. Electronically Signed   By: Jasmine Pang M.D.   On: 09/05/2019 20:26    Procedures Procedures (including critical care time)  Medications Ordered in ED Medications  dexamethasone (DECADRON) tablet 10 mg (10 mg Oral Given 09/05/19 2231)    ED Course  I have reviewed the triage vital signs and the nursing notes.  Pertinent labs & imaging results that were available during my care of the patient were reviewed by me and considered in my medical decision making (see chart for details).    MDM Rules/Calculators/A&P                          29 yo F with a cc of an upper respiratory illness.  Going on for the past week.  Patient also with an area that she had applied a Band-Aid to.  Looks like she has contact dermatitis to the periphery along where the adhesive was.  Lesion appears to be healing well.  Chest x-ray viewed by me without focal infiltrate.  Patient is otherwise well-appearing and nontoxic.  We will have her self isolate at home.  PCP follow-up.  Course of doxycycline for possible cellulitis this seems more likely to be contact dermatitis.  Paige Mathis was evaluated in Emergency Department on 09/05/2019 for the symptoms described in the history of present illness. He/she was evaluated in the context of the global COVID-19 pandemic, which necessitated consideration that the patient might be  at risk for infection with the SARS-CoV-2 virus that causes COVID-19. Institutional protocols and algorithms that pertain to the evaluation of patients at risk for COVID-19 are in a state of rapid change based on information released by regulatory bodies including the CDC and federal and state organizations. These policies and algorithms were followed during the patient's care in the ED.  10:43 PM:  I have discussed the diagnosis/risks/treatment options with the patient and believe the pt to be eligible for discharge home to follow-up with PCP. We also discussed returning to the ED immediately if  new or worsening sx occur. We discussed the sx which are most concerning (e.g., sudden worsening sob, fever, inability to tolerate by mouth) that necessitate immediate return. Medications administered to the patient during their visit and any new prescriptions provided to the patient are listed below.  Medications given during this visit Medications  dexamethasone (DECADRON) tablet 10 mg (10 mg Oral Given 09/05/19 2231)     The patient appears reasonably screen and/or stabilized for discharge and I doubt any other medical condition or other [facility] requiring further screening, evaluation, or treatment in the ED at this time prior to discharge.   Final Clinical Impression(s) / ED Diagnoses Final diagnoses:  Viral URI with cough  Suspected COVID-19 virus infection    Rx / DC Orders ED Discharge Orders         Ordered    benzonatate (TESSALON) 100 MG capsule  Every 8 hours     Discontinue  Reprint     09/05/19 2218    ondansetron (ZOFRAN ODT) 4 MG disintegrating tablet     Discontinue  Reprint     09/05/19 2218    doxycycline (VIBRAMYCIN) 100 MG capsule  2 times daily     Discontinue  Reprint     09/05/19 2218           Melene Plan, DO 09/05/19 2243

## 2019-09-05 NOTE — ED Triage Notes (Signed)
Pt sts fever, shob, cough, chills, and body aches. Recently spent 2 weeks in [place]. No vaccine.

## 2019-09-05 NOTE — Discharge Instructions (Signed)
Your test has not yet resulted.  Please call back in a couple hours to see your results or you can use your MyChart and see result then.  Please return for worsening trouble breathing or if you feel he can pass out.  Take tylenol 2 pills 4 times a day and motrin 4 pills 3 times a day.  Drink plenty of fluids.  Return for worsening shortness of breath, headache, confusion. Follow up with your family doctor.

## 2019-09-06 LAB — SARS CORONAVIRUS 2 BY RT PCR (HOSPITAL ORDER, PERFORMED IN ~~LOC~~ HOSPITAL LAB): SARS Coronavirus 2: POSITIVE — AB

## 2019-09-07 ENCOUNTER — Other Ambulatory Visit (HOSPITAL_COMMUNITY): Payer: Self-pay | Admitting: Nurse Practitioner

## 2019-09-07 ENCOUNTER — Telehealth (HOSPITAL_COMMUNITY): Payer: Self-pay | Admitting: Nurse Practitioner

## 2019-09-07 DIAGNOSIS — U071 COVID-19: Secondary | ICD-10-CM

## 2019-09-07 NOTE — Progress Notes (Signed)
I connected by phone with Paige Mathis on 09/07/2019 at 4:51 PM to discuss the potential use of an new treatment for mild to moderate COVID-19 viral infection in non-hospitalized patients.  This patient is a 29 y.o. female that meets the FDA criteria for Emergency Use Authorization of casirivimab\imdevimab.  Has a (+) direct SARS-CoV-2 viral test result  Has mild or moderate COVID-19   Is ? 28 years of age and weighs ? 40 kg  Is NOT hospitalized due to COVID-19  Is NOT requiring oxygen therapy or requiring an increase in baseline oxygen flow rate due to COVID-19  Is within 10 days of symptom onset  Has at least one of the high risk factor(s) for progression to severe COVID-19 and/or hospitalization as defined in EUA.  Specific high risk criteria : BMI > 25   I have spoken and communicated the following to the patient or parent/caregiver:  1. FDA has authorized the emergency use of casirivimab\imdevimab for the treatment of mild to moderate COVID-19 in adults and pediatric patients with positive results of direct SARS-CoV-2 viral testing who are 80 years of age and older weighing at least 40 kg, and who are at high risk for progressing to severe COVID-19 and/or hospitalization.  2. The significant known and potential risks and benefits of casirivimab\imdevimab, and the extent to which such potential risks and benefits are unknown.  3. Information on available alternative treatments and the risks and benefits of those alternatives, including clinical trials.  4. Patients treated with casirivimab\imdevimab should continue to self-isolate and use infection control measures (e.g., wear mask, isolate, social distance, avoid sharing personal items, clean and disinfect "high touch" surfaces, and frequent handwashing) according to CDC guidelines.   5. The patient or parent/caregiver has the option to accept or refuse casirivimab\imdevimab .  After reviewing this information with the  patient, The patient agreed to proceed with receiving casirivimab\imdevimab infusion and will be provided a copy of the Fact sheet prior to receiving the infusion.Consuello Masse, DNP, AGNP-C [phone] (Infusion Center Hotline)

## 2019-09-07 NOTE — Telephone Encounter (Signed)
Patient screened for MAB. Meets criteria. See orders only encounter.

## 2019-09-08 MED ORDER — SODIUM CHLORIDE 0.9 % IV SOLN
Freq: Once | INTRAVENOUS | Status: AC
  Filled 2019-09-08: qty 600

## 2019-09-09 ENCOUNTER — Ambulatory Visit (HOSPITAL_COMMUNITY)
Admission: RE | Admit: 2019-09-09 | Discharge: 2019-09-09 | Disposition: A | Discharge status: Home / Self Care | Source: Ambulatory Visit | Attending: Pulmonary Disease | Admitting: Pulmonary Disease

## 2019-09-09 DIAGNOSIS — U071 COVID-19: Secondary | ICD-10-CM | POA: Insufficient documentation

## 2019-09-09 MED ORDER — FAMOTIDINE IN NACL 20-0.9 MG/50ML-% IV SOLN: 20.0000 mg | Freq: Once | INTRAVENOUS | Status: DC | PRN

## 2019-09-09 MED ORDER — ALBUTEROL SULFATE HFA 108 (90 BASE) MCG/ACT IN AERS: 2.0000 | INHALATION_SPRAY | Freq: Once | RESPIRATORY_TRACT | Status: DC | PRN

## 2019-09-09 MED ORDER — METHYLPREDNISOLONE SODIUM SUCC 125 MG IJ SOLR: 125.0000 mg | Freq: Once | INTRAMUSCULAR | Status: DC | PRN

## 2019-09-09 MED ORDER — SODIUM CHLORIDE 0.9 % IV SOLN: INTRAVENOUS | Status: DC | PRN

## 2019-09-09 MED ORDER — EPINEPHRINE 0.3 MG/0.3ML IJ SOAJ: 0.3000 mg | Freq: Once | INTRAMUSCULAR | Status: DC | PRN

## 2019-09-09 MED ORDER — DIPHENHYDRAMINE HCL 50 MG/ML IJ SOLN: 50.0000 mg | Freq: Once | INTRAMUSCULAR | Status: DC | PRN

## 2019-09-09 NOTE — Progress Notes (Signed)
  Diagnosis: COVID-19  Physician: Dr. Wright  Procedure: Covid Infusion [facility] Med: casirivimab\imdevimab infusion - Provided patient with casirivimab\imdevimab fact sheet for patients, parents and caregivers prior to infusion.  Complications: No immediate complications noted.  Discharge: Discharged home   Evalise Abruzzese M Burnice Oestreicher 09/09/2019  

## 2019-09-09 NOTE — Discharge Instructions (Signed)

## 2020-04-19 ENCOUNTER — Encounter: Payer: Self-pay | Admitting: Family Medicine

## 2020-04-19 ENCOUNTER — Ambulatory Visit (INDEPENDENT_AMBULATORY_CARE_PROVIDER_SITE_OTHER): Admitting: Family Medicine

## 2020-04-19 ENCOUNTER — Other Ambulatory Visit: Payer: Self-pay

## 2020-04-19 VITALS — BP 114/81 | HR 67 | Temp 98.1°F | Ht 64.0 in | Wt 218.0 lb

## 2020-04-19 DIAGNOSIS — K649 Unspecified hemorrhoids: Secondary | ICD-10-CM

## 2020-04-19 DIAGNOSIS — R300 Dysuria: Secondary | ICD-10-CM

## 2020-04-19 DIAGNOSIS — R350 Frequency of micturition: Secondary | ICD-10-CM | POA: Diagnosis not present

## 2020-04-19 LAB — POCT URINALYSIS DIP (MANUAL ENTRY)
Bilirubin, UA: NEGATIVE
Blood, UA: NEGATIVE
Glucose, UA: NEGATIVE mg/dL
Ketones, POC UA: NEGATIVE mg/dL
Leukocytes, UA: NEGATIVE
Nitrite, UA: NEGATIVE
Protein Ur, POC: NEGATIVE mg/dL
Spec Grav, UA: 1.025 (ref 1.010–1.025)
Urobilinogen, UA: 0.2 E.U./dL
pH, UA: 5.5 (ref 5.0–8.0)

## 2020-04-19 LAB — POCT WET + KOH PREP
Trich by wet prep: ABSENT
Yeast by KOH: ABSENT
Yeast by wet prep: ABSENT

## 2020-04-19 MED ORDER — HYDROCORTISONE ACETATE 25 MG RE SUPP: 25.0000 mg | Freq: Two times a day (BID) | RECTAL | 0 refills | Status: AC

## 2020-04-19 MED ORDER — HYDROCORTISONE 2.5 % EX OINT: TOPICAL_OINTMENT | Freq: Two times a day (BID) | CUTANEOUS | 2 refills | Status: AC

## 2020-04-19 MED ORDER — DOCUSATE SODIUM 100 MG PO CAPS: 100.0000 mg | ORAL_CAPSULE | Freq: Two times a day (BID) | ORAL | 3 refills | Status: AC

## 2020-04-19 NOTE — Patient Instructions (Addendum)
Hemorrhoids Hemorrhoids are swollen veins that may develop:  In the butt (rectum). These are called internal hemorrhoids.  Around the opening of the butt (anus). These are called external hemorrhoids. Hemorrhoids can cause pain, itching, or bleeding. Most of the time, they do not cause serious problems. They usually get better with diet changes, lifestyle changes, and other home treatments. What are the causes? This condition may be caused by:  Having trouble pooping (constipation).  Pushing hard (straining) to poop.  Watery poop (diarrhea).  Pregnancy.  Being very overweight (obese).  Sitting for long periods of time.  Heavy lifting or other activity that causes you to strain.  Anal sex.  Riding a bike for a long period of time. What are the signs or symptoms? Symptoms of this condition include:  Pain.  Itching or soreness in the butt.  Bleeding from the butt.  Leaking poop.  Swelling in the area.  One or more lumps around the opening of your butt. How is this diagnosed? A doctor can often diagnose this condition by looking at the affected area. The doctor may also:  Do an exam that involves feeling the area with a gloved hand (digital rectal exam).  Examine the area inside your butt using a small tube (anoscope).  Order blood tests. This may be done if you have lost a lot of blood.  Have you get a test that involves looking inside the colon using a flexible tube with a camera on the end (sigmoidoscopy or colonoscopy). How is this treated? This condition can usually be treated at home. Your doctor may tell you to change what you eat, make lifestyle changes, or try home treatments. If these do not help, procedures can be done to remove the hemorrhoids or make them smaller. These may involve:  Placing rubber bands at the base of the hemorrhoids to cut off their blood supply.  Injecting medicine into the hemorrhoids to shrink them.  Shining a type of  light energy onto the hemorrhoids to cause them to fall off.  Doing surgery to remove the hemorrhoids or cut off their blood supply. Follow these instructions at home: Eating and drinking  Eat foods that have a lot of fiber in them. These include whole grains, beans, nuts, fruits, and vegetables.  Ask your doctor about taking products that have added fiber (fibersupplements).  Reduce the amount of fat in your diet. You can do this by: ? Eating low-fat dairy products. ? Eating less red meat. ? Avoiding processed foods.  Drink enough fluid to keep your pee (urine) pale yellow.   Managing pain and swelling  Take a warm-water bath (sitz bath) for 20 minutes to ease pain. Do this 3-4 times a day. You may do this in a bathtub or using a portable sitz bath that fits over the toilet.  If told, put ice on the painful area. It may be helpful to use ice between your warm baths. ? Put ice in a plastic bag. ? Place a towel between your skin and the bag. ? Leave the ice on for 20 minutes, 2-3 times a day.   General instructions  Take over-the-counter and prescription medicines only as told by your doctor. ? Medicated creams and medicines may be used as told.  Exercise often. Ask your doctor how much and what kind of exercise is best for you.  Go to the bathroom when you have the urge to poop. Do not wait.  Avoid pushing too hard when you  poop.  Keep your butt dry and clean. Use wet toilet paper or moist towelettes after pooping.  Do not sit on the toilet for a long time.  Keep all follow-up visits as told by your doctor. This is important. Contact a doctor if you:  Have pain and swelling that do not get better with treatment or medicine.  Have trouble pooping.  Cannot poop.  Have pain or swelling outside the area of the hemorrhoids. Get help right away if you have:  Bleeding that will not stop. Summary  Hemorrhoids are swollen veins in the butt or around the opening of the  butt.  They can cause pain, itching, or bleeding.  Eat foods that have a lot of fiber in them. These include whole grains, beans, nuts, fruits, and vegetables.  Take a warm-water bath (sitz bath) for 20 minutes to ease pain. Do this 3-4 times a day. This information is not intended to replace advice given to you by your health care provider. Make sure you discuss any questions you have with your health care provider. Document Revised: 01/31/2018 Document Reviewed: 06/14/2017 Elsevier Patient Education  2021 [employer].   If you have lab work done today you will be contacted with your lab results within the next 2 weeks.  If you have not heard from [place] then please contact us. The fastest way to get your results is to register for My Chart.   IF you received an x-ray today, you will receive an invoice from [facility] Radiology. Please contact [facility] at [phone] with questions or concerns regarding your invoice.   IF you received labwork today, you will receive an invoice from [facility]. Please contact [facility] at [phone] with questions or concerns regarding your invoice.   Our billing staff will not be able to assist you with questions regarding bills from these companies.  You will be contacted with the lab results as soon as they are available. The fastest way to get your results is to activate your My Chart account. Instructions are located on the last page of this paperwork. If you have not heard from [place] regarding the results in 2 weeks, please contact this office.

## 2020-04-19 NOTE — Progress Notes (Signed)
3/14/202210:11 AM  Paige Mathis 24-Feb-1990, 30 y.o., female [id number]  Chief Complaint  Patient presents with  . Hemorrhoids    Some bleeding noticed started 1 week ago in stool and on tissue   . possible uit    Pain and frequency x 5 days    HPI:   Patient is a 30 y.o. female with no significant past medical history who presents today for dysuria and hemorrhoids.  Has been having dysuria since wed Does have frequent UTIs Not sure when she had the last one Drinks coffee once a day Drinks soda now  Has had a small amount of blood in stool and when wipes Has pain with bowel movements This has been going on for a week Has a history of hemorrhoids Uses Cranberry pills, probiotics and water Has been using hemorrhoid cream Does have issues with constipation   Depression screen [facility] 2/9 04/19/2020 09/05/2019 08/08/2019  Decreased Interest 0 0 0  Down, Depressed, Hopeless 0 0 0  PHQ - 2 Score 0 0 0    Fall Risk  04/19/2020 09/05/2019 08/08/2019 06/28/2017  Falls in the past year? 0 0 0 No  Number falls in past yr: 0 0 0 -  Injury with Fall? 0 0 0 -  Follow up Falls evaluation completed Falls evaluation completed Falls evaluation completed -     No Known Allergies  Prior to Admission medications   Not on File    History reviewed. No pertinent past medical history.  History reviewed. No pertinent surgical history.  Social History   Tobacco Use  . Smoking status: Current Every Day Smoker    Types: Cigarettes  . Smokeless tobacco: Never Used  Substance Use Topics  . Alcohol use: No    Family History  Problem Relation Age of Onset  . Healthy Mother   . Healthy Father     Review of Systems  Constitutional: Negative for chills, fever and malaise/fatigue.  Respiratory: Negative for cough and shortness of breath.   Cardiovascular: Negative for chest pain and palpitations.  Gastrointestinal: Positive for blood in stool and constipation. Negative for  abdominal pain, diarrhea, heartburn, nausea and vomiting.  Genitourinary: Positive for dysuria and frequency. Negative for flank pain, hematuria and urgency.  Musculoskeletal: Negative for back pain and myalgias.  Skin: Negative for rash.  Neurological: Negative for dizziness and headaches.     OBJECTIVE:  Today's Vitals   04/19/20 0943  BP: 114/81  Pulse: 67  Temp: 98.1 F (36.7 C)  SpO2: 99%  Weight: 218 lb (98.9 kg)  Height: 5\' 4"  (1.626 m)   Body mass index is 37.42 kg/m.   Physical Exam Constitutional:      General: She is not in acute distress.    Appearance: Normal appearance. She is not ill-appearing.  HENT:     Head: Normocephalic.  Cardiovascular:     Rate and Rhythm: Normal rate and regular rhythm.     Pulses: Normal pulses.     Heart sounds: Normal heart sounds. No murmur heard. No friction rub. No gallop.   Pulmonary:     Effort: Pulmonary effort is normal. No respiratory distress.     Breath sounds: Normal breath sounds. No stridor. No wheezing, rhonchi or rales.  Abdominal:     General: Bowel sounds are normal.     Palpations: Abdomen is soft.     Tenderness: There is no abdominal tenderness. There is no right CVA tenderness or left CVA tenderness.  Musculoskeletal:  Right lower leg: No edema.     Left lower leg: No edema.  Skin:    General: Skin is warm and dry.  Neurological:     Mental Status: She is alert and oriented to person, place, and time.  Psychiatric:        Mood and Affect: Mood normal.        Behavior: Behavior normal.     Results for orders placed or performed in visit on 04/19/20 (from the past 24 hour(s))  POCT Wet + KOH Prep     Status: Abnormal   Collection Time: 04/19/20 10:03 AM  Result Value Ref Range   Yeast by KOH Absent Absent   Yeast by wet prep Absent Absent   WBC by wet prep None (A) Few   Clue Cells Wet Prep HPF POC Few (A) None   Trich by wet prep Absent Absent   Bacteria Wet Prep HPF POC Few Few    Epithelial Cells By [employer] ([facility]) Moderate (A) None, Few, Too numerous to count   RBC,UR,HPF,POC None None RBC/hpf  POCT urinalysis dipstick     Status: None   Collection Time: 04/19/20 10:04 AM  Result Value Ref Range   Color, UA yellow yellow   Clarity, UA clear clear   Glucose, UA negative negative mg/dL   Bilirubin, UA negative negative   Ketones, POC UA negative negative mg/dL   Spec Grav, UA 5.956 3.875 - 1.025   Blood, UA negative negative   pH, UA 5.5 5.0 - 8.0   Protein Ur, POC negative negative mg/dL   Urobilinogen, UA 0.2 0.2 or 1.0 E.U./dL   Nitrite, UA Negative Negative   Leukocytes, UA Negative Negative    No results found.   ASSESSMENT and PLAN  Problem List Items Addressed This Visit   None   Visit Diagnoses    Urinary frequency    -  Primary   Relevant Orders   POCT Wet + KOH Prep (Completed)   POCT urinalysis dipstick (Completed)   Urine Culture   Hemorrhoids, unspecified hemorrhoid type       Relevant Medications   hydrocortisone (ANUSOL-HC) 25 MG suppository   hydrocortisone 2.5 % ointment   docusate sodium (COLACE) 100 MG capsule      Plan . Suppositories and external steroids bid for 7-14 days . RTC/ED precautions provided . Limit bladder irritants such as caffeine   Return if symptoms worsen or fail to improve.    Macario Carls Latasha Puskas, FNP-BC Primary Care at [facility] [address] [place], [place] [address] Ph.  [phone] Fax [phone]

## 2020-04-20 ENCOUNTER — Ambulatory Visit: Admitting: Family Medicine

## 2020-04-20 LAB — URINE CULTURE

## 2021-02-02 ENCOUNTER — Emergency Department (HOSPITAL_COMMUNITY)

## 2021-02-02 ENCOUNTER — Emergency Department (HOSPITAL_COMMUNITY)
Admission: EM | Admit: 2021-02-02 | Discharge: 2021-02-02 | Disposition: A | Discharge status: Home / Self Care | Attending: Emergency Medicine | Admitting: Emergency Medicine

## 2021-02-02 ENCOUNTER — Encounter (HOSPITAL_COMMUNITY): Payer: Self-pay | Admitting: Emergency Medicine

## 2021-02-02 DIAGNOSIS — M25512 Pain in left shoulder: Secondary | ICD-10-CM | POA: Insufficient documentation

## 2021-02-02 DIAGNOSIS — S32425A Nondisplaced fracture of posterior wall of left acetabulum, initial encounter for closed fracture: Secondary | ICD-10-CM | POA: Insufficient documentation

## 2021-02-02 DIAGNOSIS — Y900 Blood alcohol level of less than 20 mg/100 ml: Secondary | ICD-10-CM | POA: Diagnosis not present

## 2021-02-02 DIAGNOSIS — Y92410 Unspecified street and highway as the place of occurrence of the external cause: Secondary | ICD-10-CM | POA: Insufficient documentation

## 2021-02-02 DIAGNOSIS — M25519 Pain in unspecified shoulder: Secondary | ICD-10-CM

## 2021-02-02 DIAGNOSIS — S79912A Unspecified injury of left hip, initial encounter: Secondary | ICD-10-CM | POA: Diagnosis present

## 2021-02-02 DIAGNOSIS — Z20822 Contact with and (suspected) exposure to covid-19: Secondary | ICD-10-CM | POA: Diagnosis not present

## 2021-02-02 DIAGNOSIS — S80212A Abrasion, left knee, initial encounter: Secondary | ICD-10-CM | POA: Diagnosis not present

## 2021-02-02 DIAGNOSIS — S73015A Posterior dislocation of left hip, initial encounter: Secondary | ICD-10-CM | POA: Insufficient documentation

## 2021-02-02 DIAGNOSIS — Z23 Encounter for immunization: Secondary | ICD-10-CM | POA: Diagnosis not present

## 2021-02-02 DIAGNOSIS — F17210 Nicotine dependence, cigarettes, uncomplicated: Secondary | ICD-10-CM | POA: Diagnosis not present

## 2021-02-02 LAB — COMPREHENSIVE METABOLIC PANEL
ALT: 72 U/L — ABNORMAL HIGH (ref 0–44)
AST: 51 U/L — ABNORMAL HIGH (ref 15–41)
Albumin: 3.7 g/dL (ref 3.5–5.0)
Alkaline Phosphatase: 65 U/L (ref 38–126)
Anion gap: 8 (ref 5–15)
BUN: 9 mg/dL (ref 6–20)
CO2: 24 mmol/L (ref 22–32)
Calcium: 8.7 mg/dL — ABNORMAL LOW (ref 8.9–10.3)
Chloride: 105 mmol/L (ref 98–111)
Creatinine, Ser: 0.75 mg/dL (ref 0.44–1.00)
GFR, Estimated: >60 mL/min (ref >60)
Glucose, Bld: 121 mg/dL — ABNORMAL HIGH (ref 70–99)
Potassium: 3.9 mmol/L (ref 3.5–5.1)
Sodium: 137 mmol/L (ref 135–145)
Total Bilirubin: 0.7 mg/dL (ref 0.3–1.2)
Total Protein: 6.8 g/dL (ref 6.5–8.1)

## 2021-02-02 LAB — CBC
HCT: 38.3 % (ref 36.0–46.0)
Hemoglobin: 12.9 g/dL (ref 12.0–15.0)
MCH: 21.6 pg — ABNORMAL LOW (ref 26.0–34.0)
MCHC: 33.7 g/dL (ref 30.0–36.0)
MCV: 64.2 fL — ABNORMAL LOW (ref 80.0–100.0)
Platelets: 377 10*3/uL (ref 150–400)
RBC: 5.97 MIL/uL — ABNORMAL HIGH (ref 3.87–5.11)
RDW: 16.9 % — ABNORMAL HIGH (ref 11.5–15.5)
WBC: 15.6 10*3/uL — ABNORMAL HIGH (ref 4.0–10.5)
nRBC: 0.2 % (ref 0.0–0.2)

## 2021-02-02 LAB — I-STAT CHEM 8, ED
BUN: 11 mg/dL (ref 6–20)
Calcium, Ion: 1.1 mmol/L — ABNORMAL LOW (ref 1.15–1.40)
Chloride: 104 mmol/L (ref 98–111)
Creatinine, Ser: 0.6 mg/dL (ref 0.44–1.00)
Glucose, Bld: 117 mg/dL — ABNORMAL HIGH (ref 70–99)
HCT: 41 % (ref 36.0–46.0)
Hemoglobin: 13.9 g/dL (ref 12.0–15.0)
Potassium: 3.9 mmol/L (ref 3.5–5.1)
Sodium: 139 mmol/L (ref 135–145)
TCO2: 26 mmol/L (ref 22–32)

## 2021-02-02 LAB — I-STAT BETA HCG BLOOD, ED (MC, WL, AP ONLY): I-stat hCG, quantitative: <5 m[IU]/mL (ref <5)

## 2021-02-02 LAB — LACTIC ACID, PLASMA: Lactic Acid, Venous: 2.1 mmol/L (ref 0.5–1.9)

## 2021-02-02 LAB — RESP PANEL BY RT-PCR (FLU A&B, COVID) ARPGX2
Influenza A by PCR: NEGATIVE
Influenza B by PCR: NEGATIVE
SARS Coronavirus 2 by RT PCR: NEGATIVE

## 2021-02-02 LAB — SAMPLE TO BLOOD BANK

## 2021-02-02 LAB — ETHANOL: Alcohol, Ethyl (B): <10 mg/dL (ref <10)

## 2021-02-02 LAB — PROTIME-INR
INR: 0.9 (ref 0.8–1.2)
Prothrombin Time: 12.5 seconds (ref 11.4–15.2)

## 2021-02-02 MED ORDER — FENTANYL CITRATE PF 50 MCG/ML IJ SOSY
PREFILLED_SYRINGE | INTRAMUSCULAR | Status: AC
  Filled 2021-02-02: qty 1

## 2021-02-02 MED ORDER — FENTANYL CITRATE PF 50 MCG/ML IJ SOSY: 50.0000 ug | PREFILLED_SYRINGE | INTRAMUSCULAR | Status: DC | PRN

## 2021-02-02 MED ORDER — HYDROCODONE-ACETAMINOPHEN 5-325 MG PO TABS: 1.0000 | ORAL_TABLET | Freq: Four times a day (QID) | ORAL | 0 refills | Status: AC | PRN

## 2021-02-02 MED ORDER — TETANUS-DIPHTH-ACELL PERTUSSIS 5-2.5-18.5 LF-MCG/0.5 IM SUSY
0.5000 mL | PREFILLED_SYRINGE | Freq: Once | INTRAMUSCULAR | Status: AC
  Administered 2021-02-02: 10:00:00 0.5 mL via INTRAMUSCULAR

## 2021-02-02 MED ORDER — HYDROMORPHONE HCL 1 MG/ML IJ SOLN
1.0000 mg | Freq: Once | INTRAMUSCULAR | Status: AC
  Administered 2021-02-02: 10:00:00 1 mg via INTRAVENOUS
  Filled 2021-02-02: qty 1

## 2021-02-02 MED ORDER — FENTANYL CITRATE PF 50 MCG/ML IJ SOSY
PREFILLED_SYRINGE | INTRAMUSCULAR | Status: AC | PRN
  Administered 2021-02-02: 50 ug via INTRAVENOUS

## 2021-02-02 MED ORDER — LACTATED RINGERS IV BOLUS: 1000.0000 mL | Freq: Once | INTRAVENOUS | Status: DC

## 2021-02-02 MED ORDER — PROPOFOL 10 MG/ML IV BOLUS: 0.5000 mg/kg | Freq: Once | INTRAVENOUS | Status: DC

## 2021-02-02 MED ORDER — PROPOFOL 10 MG/ML IV BOLUS
INTRAVENOUS | Status: AC | PRN
  Administered 2021-02-02 (×2): 49.9 mg via INTRAVENOUS

## 2021-02-02 MED ORDER — ONDANSETRON HCL 4 MG/2ML IJ SOLN
4.0000 mg | Freq: Once | INTRAMUSCULAR | Status: AC
  Administered 2021-02-02: 10:00:00 4 mg via INTRAVENOUS
  Filled 2021-02-02: qty 2
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
## 2021-02-02 NOTE — ED Notes (Signed)
Portable xrays revealed L hip dislocation. Will perform L hip reduction prior to CT.
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
## 2021-02-02 NOTE — Discharge Instructions (Addendum)
You were evaluated in the Emergency Department and after careful evaluation, we did not find any emergent condition requiring admission or further testing in the hospital.  Your exam/testing today was Positive for right posterior hip dislocation on the left which was subsequently reduced in the emergency department under procedural sedation without complication.  CT imaging of your left hip does show a nondisplaced acetabular fracture.  Orthopedics did recommend partial weightbearing as tolerated.  You have been provided with a knee immobilizer and crutches.  Recommend follow-up with orthopedics in clinic in 1 week's time.  Please return to the Emergency Department if you experience any worsening of your condition.  Thank you for allowing [place] to be a part of your care.
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
